# Patient Record
Sex: Male | Born: 1945 | Race: Black or African American | Hispanic: No | Marital: Married
Health system: Southern US, Community
[De-identification: ages and names within clinical notes are randomized; demographics above are authoritative.]

---

## 2007-12-02 ENCOUNTER — Ambulatory Visit: Payer: Self-pay | Admitting: Internal Medicine

## 2008-03-06 ENCOUNTER — Other Ambulatory Visit: Payer: Self-pay

## 2008-03-06 ENCOUNTER — Inpatient Hospital Stay: Payer: Self-pay | Admitting: Internal Medicine

## 2008-03-12 ENCOUNTER — Ambulatory Visit: Payer: Self-pay | Admitting: Family Medicine

## 2008-10-18 IMAGING — CT CT ABD-PELV W/O CM
1 of 2 series · 15 of 32 positions shown, 19 images · non-contrast
Comparison: none

REASON FOR EXAM: (1) abdominal pain, acute renal failure, vomiting; (2)
abdominal pain, vomiting
COMMENTS:

[Series 2: abdomen · axial · 0.93mm/px · z∈[+94,+529]mm · 15 of 95 slices shown, 19 images]
[im 4/95  soft-tissue]
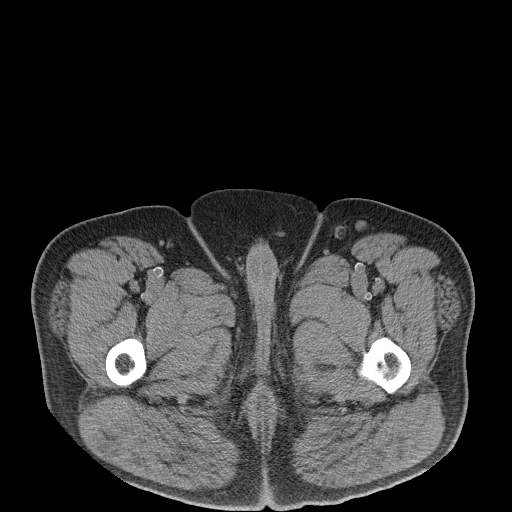
[im 4/95  bone]
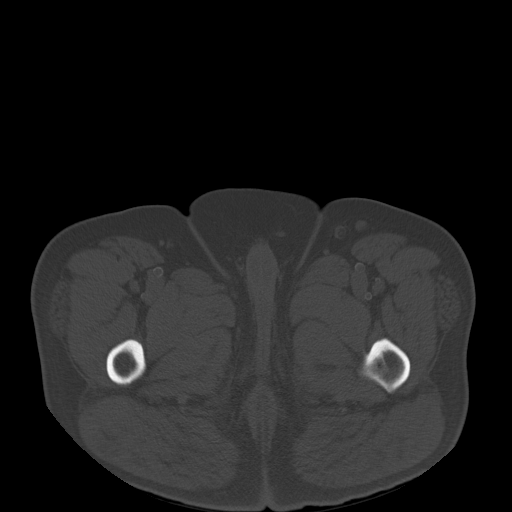
[im 11/95  soft-tissue]
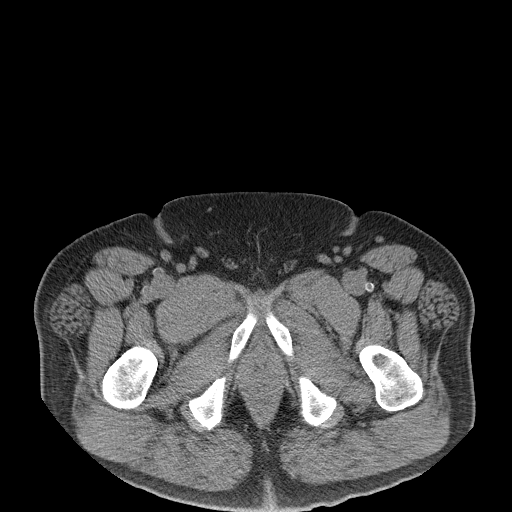
[im 19/95  soft-tissue]
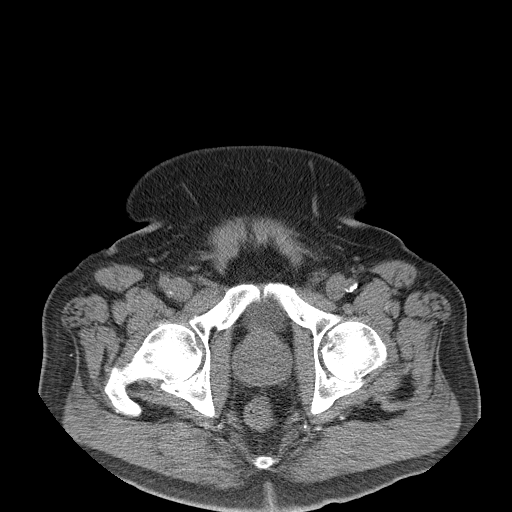
[im 26/95  soft-tissue]
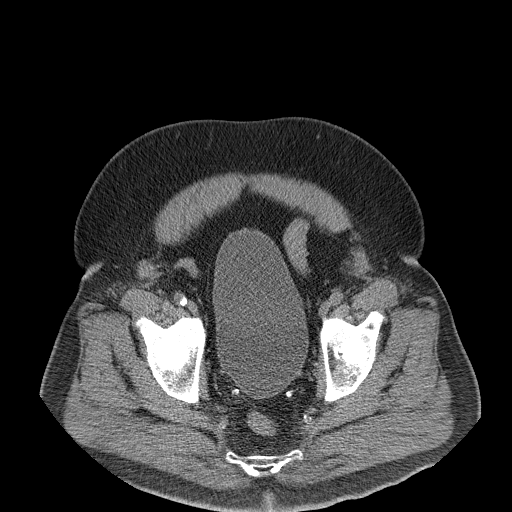
[im 33/95  soft-tissue]
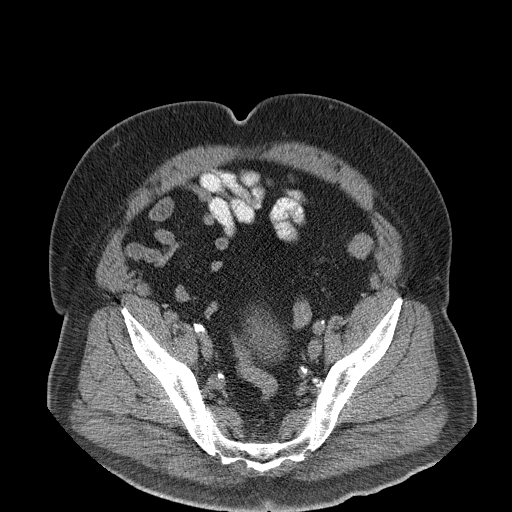
[im 40/95  soft-tissue]
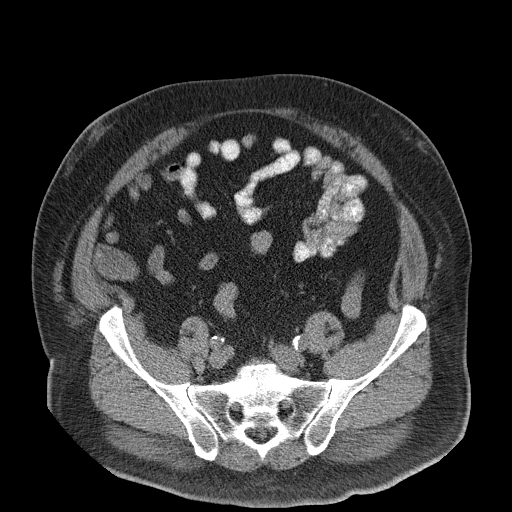
[im 48/95  soft-tissue]
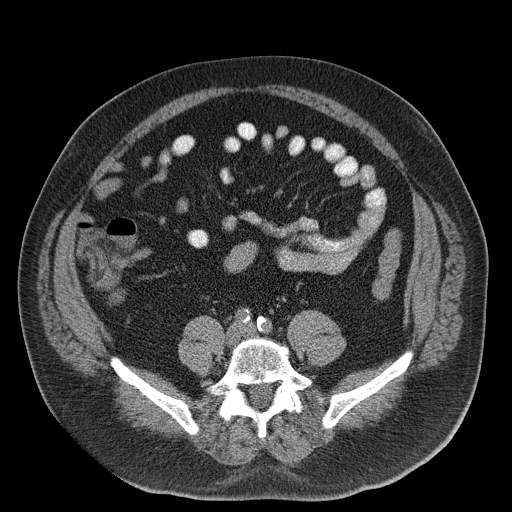
[im 55/95  soft-tissue]
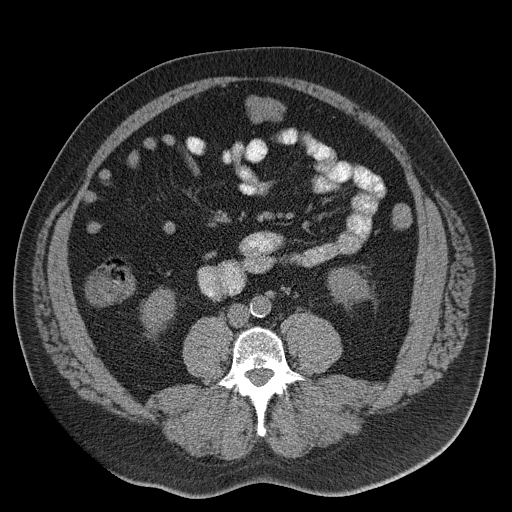
[im 62/95  soft-tissue]
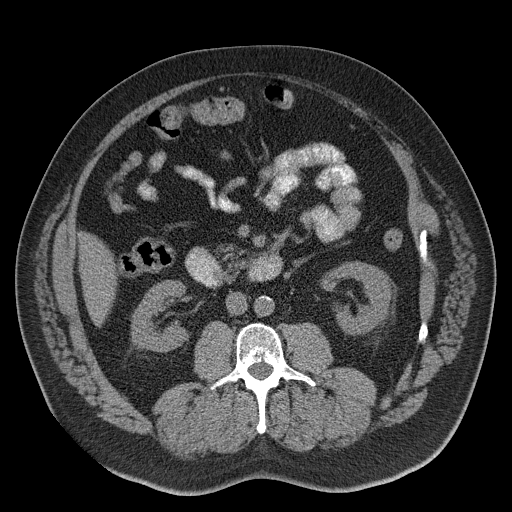
[im 62/95  bone]
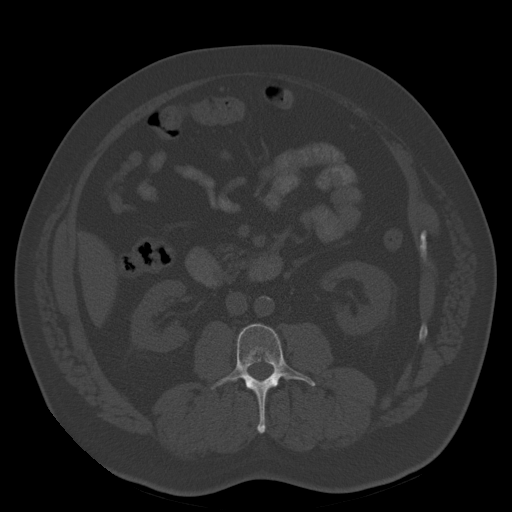
[im 69/95  soft-tissue]
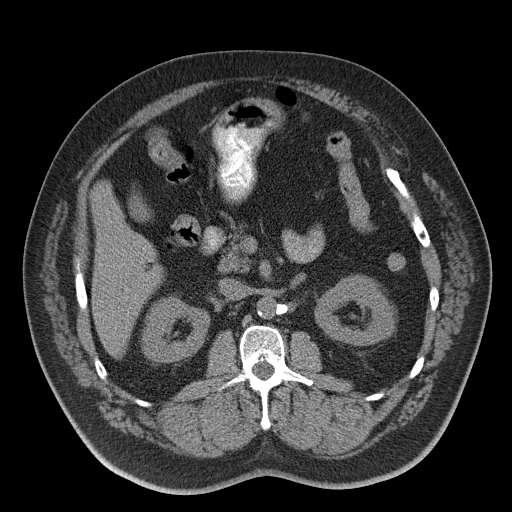
[im 76/95  soft-tissue]
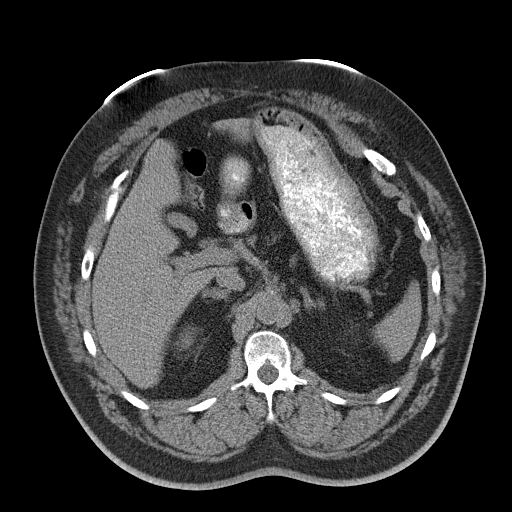
[im 80/95  lung]
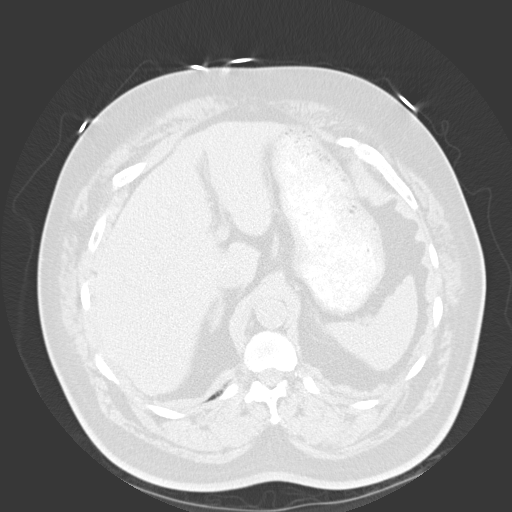
[im 84/95  soft-tissue]
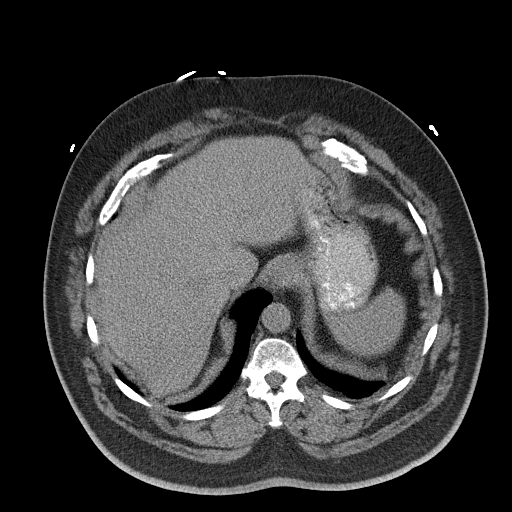
[im 84/95  lung]
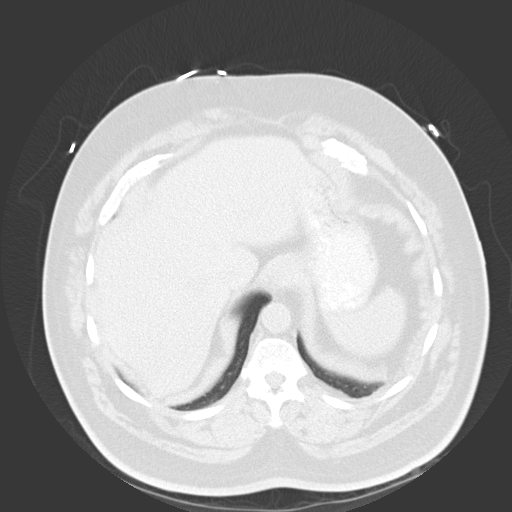
[im 87/95  lung]
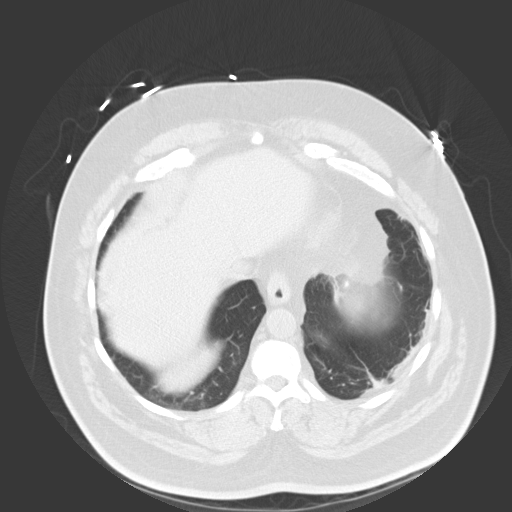
[im 91/95  soft-tissue]
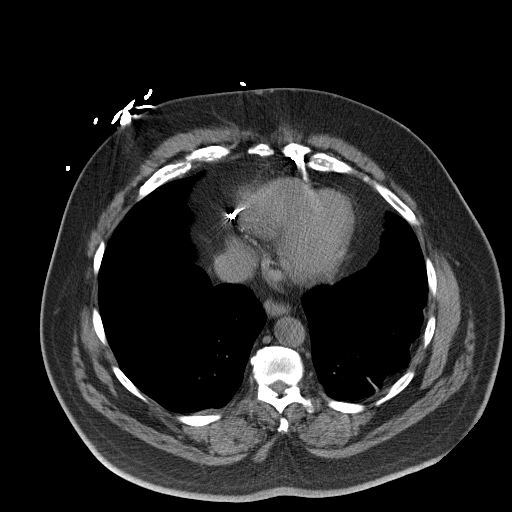
[im 91/95  lung]
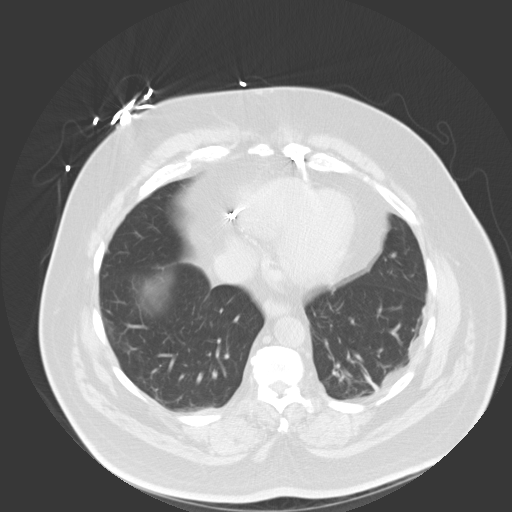

[15 of 32 positions shown; findings below may reference images not displayed]

PROCEDURE:     CT  - CT ABDOMEN AND PELVIS W[DATE] [DATE]

RESULT:     Helical non-contrast 5 mm sections were obtained from the lung
bases through the pubic symphysis.

Evaluation of the lung bases demonstrates hypoventilation within the RIGHT
and LEFT lung base. Within the limitations of a noncontrast CT, the liver,
spleen, adrenals, pancreas, and kidney are unremarkable.

Evaluation of the LEFT kidney demonstrates a low attenuating rounded mass
along the posterior midpole region possibly representing a cyst. There is no
evidence of abdominal or pelvic free fluid, drainable loculated fluid
collections, masses nor adenopathy. There is no CT evidence of bowel
obstruction, diverticulitis, appendicitis, free air nor an abdominal aortic
aneurysm. Mural calcifications are identified within the abdominal aorta.
IMPRESSION: 1.No CT evidence of focal or acute intraabdominal or intrapelvic
abnormalities within the limitations of a non-IV contrast CT. Note; the
patient did receive oral contrast. The urinary bladder is partially
distended with urine and if clinically warranted catheterization may be
beneficial.

2.Findings likely representing a cyst involving the LEFT kidney. This can be
further evaluated with ultrasound, if clinically warranted.

## 2011-05-14 ENCOUNTER — Ambulatory Visit: Payer: Self-pay

## 2011-05-31 ENCOUNTER — Ambulatory Visit: Payer: Self-pay

## 2011-06-07 ENCOUNTER — Ambulatory Visit: Payer: Self-pay

## 2012-02-01 ENCOUNTER — Inpatient Hospital Stay: Payer: Self-pay | Admitting: Internal Medicine

## 2012-02-01 LAB — COMPREHENSIVE METABOLIC PANEL
Albumin: 4.2 g/dL (ref 3.4–5.0)
Alkaline Phosphatase: 93 U/L (ref 50–136)
Anion Gap: 12 (ref 7–16)
Bilirubin,Total: 0.2 mg/dL (ref 0.2–1.0)
Calcium, Total: 9.9 mg/dL (ref 8.5–10.1)
Chloride: 104 mmol/L (ref 98–107)
Creatinine: 3.29 mg/dL — ABNORMAL HIGH (ref 0.60–1.30)
EGFR (Non-African Amer.): 19 — ABNORMAL LOW
Glucose: 110 mg/dL — ABNORMAL HIGH (ref 65–99)
Osmolality: 314 (ref 275–301)
Potassium: 3.6 mmol/L (ref 3.5–5.1)
SGOT(AST): 28 U/L (ref 15–37)
SGPT (ALT): 23 U/L
Total Protein: 9.1 g/dL — ABNORMAL HIGH (ref 6.4–8.2)

## 2012-02-01 LAB — URINALYSIS, COMPLETE
Bacteria: NONE SEEN
Bilirubin,UR: NEGATIVE
Hyaline Cast: 12
Ketone: NEGATIVE
Leukocyte Esterase: NEGATIVE
Nitrite: NEGATIVE
Protein: >=500
Squamous Epithelial: NONE SEEN
WBC UR: <1 /HPF (ref 0–5)

## 2012-02-01 LAB — CBC
HCT: 35.3 % — ABNORMAL LOW (ref 40.0–52.0)
HGB: 11.9 g/dL — ABNORMAL LOW (ref 13.0–18.0)
MCH: 29.8 pg (ref 26.0–34.0)
MCHC: 33.8 g/dL (ref 32.0–36.0)
MCV: 88 fL (ref 80–100)
Platelet: 249 10*3/uL (ref 150–440)
RBC: 4 10*6/uL — ABNORMAL LOW (ref 4.40–5.90)

## 2012-02-01 LAB — TROPONIN I
Troponin-I: 0.07 ng/mL — ABNORMAL HIGH
Troponin-I: 0.67 ng/mL — ABNORMAL HIGH

## 2012-02-01 LAB — CK-MB
CK-MB: 10.3 ng/mL — ABNORMAL HIGH (ref 0.5–3.6)
CK-MB: 9.7 ng/mL — ABNORMAL HIGH (ref 0.5–3.6)

## 2012-02-01 LAB — LIPASE, BLOOD: Lipase: 120 U/L (ref 73–393)

## 2012-02-01 LAB — APTT
Activated PTT: 36.2 secs — ABNORMAL HIGH (ref 23.6–35.9)
Activated PTT: >160 secs (ref 23.6–35.9)

## 2012-02-01 LAB — CK TOTAL AND CKMB (NOT AT ARMC): CK-MB: 5.1 ng/mL — ABNORMAL HIGH (ref 0.5–3.6)

## 2012-02-02 LAB — CBC WITH DIFFERENTIAL/PLATELET
Eosinophil #: 0 10*3/uL (ref 0.0–0.7)
Eosinophil %: 0.6 %
HCT: 31.8 % — ABNORMAL LOW (ref 40.0–52.0)
HGB: 10.5 g/dL — ABNORMAL LOW (ref 13.0–18.0)
Lymphocyte #: 1.3 10*3/uL (ref 1.0–3.6)
MCHC: 32.9 g/dL (ref 32.0–36.0)
Monocyte #: 0.4 x10 3/mm (ref 0.2–1.0)
Neutrophil #: 5.4 10*3/uL (ref 1.4–6.5)
Platelet: 196 10*3/uL (ref 150–440)
RBC: 3.56 10*6/uL — ABNORMAL LOW (ref 4.40–5.90)
WBC: 7.2 10*3/uL (ref 3.8–10.6)

## 2012-02-02 LAB — BASIC METABOLIC PANEL
Anion Gap: 8 (ref 7–16)
BUN: 81 mg/dL — ABNORMAL HIGH (ref 7–18)
Calcium, Total: 8.3 mg/dL — ABNORMAL LOW (ref 8.5–10.1)
Chloride: 104 mmol/L (ref 98–107)
EGFR (African American): 24 — ABNORMAL LOW
EGFR (Non-African Amer.): 21 — ABNORMAL LOW
Glucose: 286 mg/dL — ABNORMAL HIGH (ref 65–99)
Osmolality: 316 (ref 275–301)
Potassium: 4.5 mmol/L (ref 3.5–5.1)
Sodium: 141 mmol/L (ref 136–145)

## 2012-02-02 LAB — MAGNESIUM: Magnesium: 2.4 mg/dL

## 2012-02-02 LAB — APTT: Activated PTT: 129 secs — ABNORMAL HIGH (ref 23.6–35.9)

## 2012-11-24 ENCOUNTER — Emergency Department: Payer: Self-pay | Admitting: Emergency Medicine

## 2012-11-24 LAB — CBC
HCT: 32.3 % — ABNORMAL LOW (ref 40.0–52.0)
HGB: 10.5 g/dL — ABNORMAL LOW (ref 13.0–18.0)
MCH: 28.2 pg (ref 26.0–34.0)
RDW: 16.3 % — ABNORMAL HIGH (ref 11.5–14.5)
WBC: 8.3 10*3/uL (ref 3.8–10.6)

## 2012-11-25 LAB — BASIC METABOLIC PANEL
Anion Gap: 8 (ref 7–16)
BUN: 72 mg/dL — ABNORMAL HIGH (ref 7–18)
Calcium, Total: 8.2 mg/dL — ABNORMAL LOW (ref 8.5–10.1)
EGFR (African American): 16 — ABNORMAL LOW
Glucose: 145 mg/dL — ABNORMAL HIGH (ref 65–99)
Osmolality: 298 (ref 275–301)
Potassium: 4.1 mmol/L (ref 3.5–5.1)
Sodium: 137 mmol/L (ref 136–145)

## 2012-11-25 LAB — CK TOTAL AND CKMB (NOT AT ARMC): CK, Total: 756 U/L — ABNORMAL HIGH (ref 35–232)

## 2012-11-25 LAB — TROPONIN I: Troponin-I: 0.23 ng/mL — ABNORMAL HIGH

## 2012-11-25 LAB — HEPATIC FUNCTION PANEL A (ARMC)
Alkaline Phosphatase: 90 U/L (ref 50–136)
Bilirubin, Direct: <0.05 mg/dL (ref 0.00–0.20)
Bilirubin,Total: 0.2 mg/dL (ref 0.2–1.0)
SGOT(AST): 30 U/L (ref 15–37)

## 2012-11-25 LAB — PRO B NATRIURETIC PEPTIDE: B-Type Natriuretic Peptide: 3350 pg/mL — ABNORMAL HIGH (ref 0–125)

## 2012-11-25 LAB — APTT: Activated PTT: 43.6 secs — ABNORMAL HIGH (ref 23.6–35.9)

## 2012-11-25 LAB — PROTIME-INR
INR: 1.1
Prothrombin Time: 14.7 secs (ref 11.5–14.7)

## 2013-04-19 ENCOUNTER — Ambulatory Visit: Payer: Self-pay | Admitting: Emergency Medicine

## 2013-05-18 DEATH — deceased

## 2013-07-08 IMAGING — CR DG CHEST 1V PORT
1 series · 1 of 1 positions shown · non-contrast
Comparison: none

REASON FOR EXAM: Chest Pain
COMMENTS:

[ap]
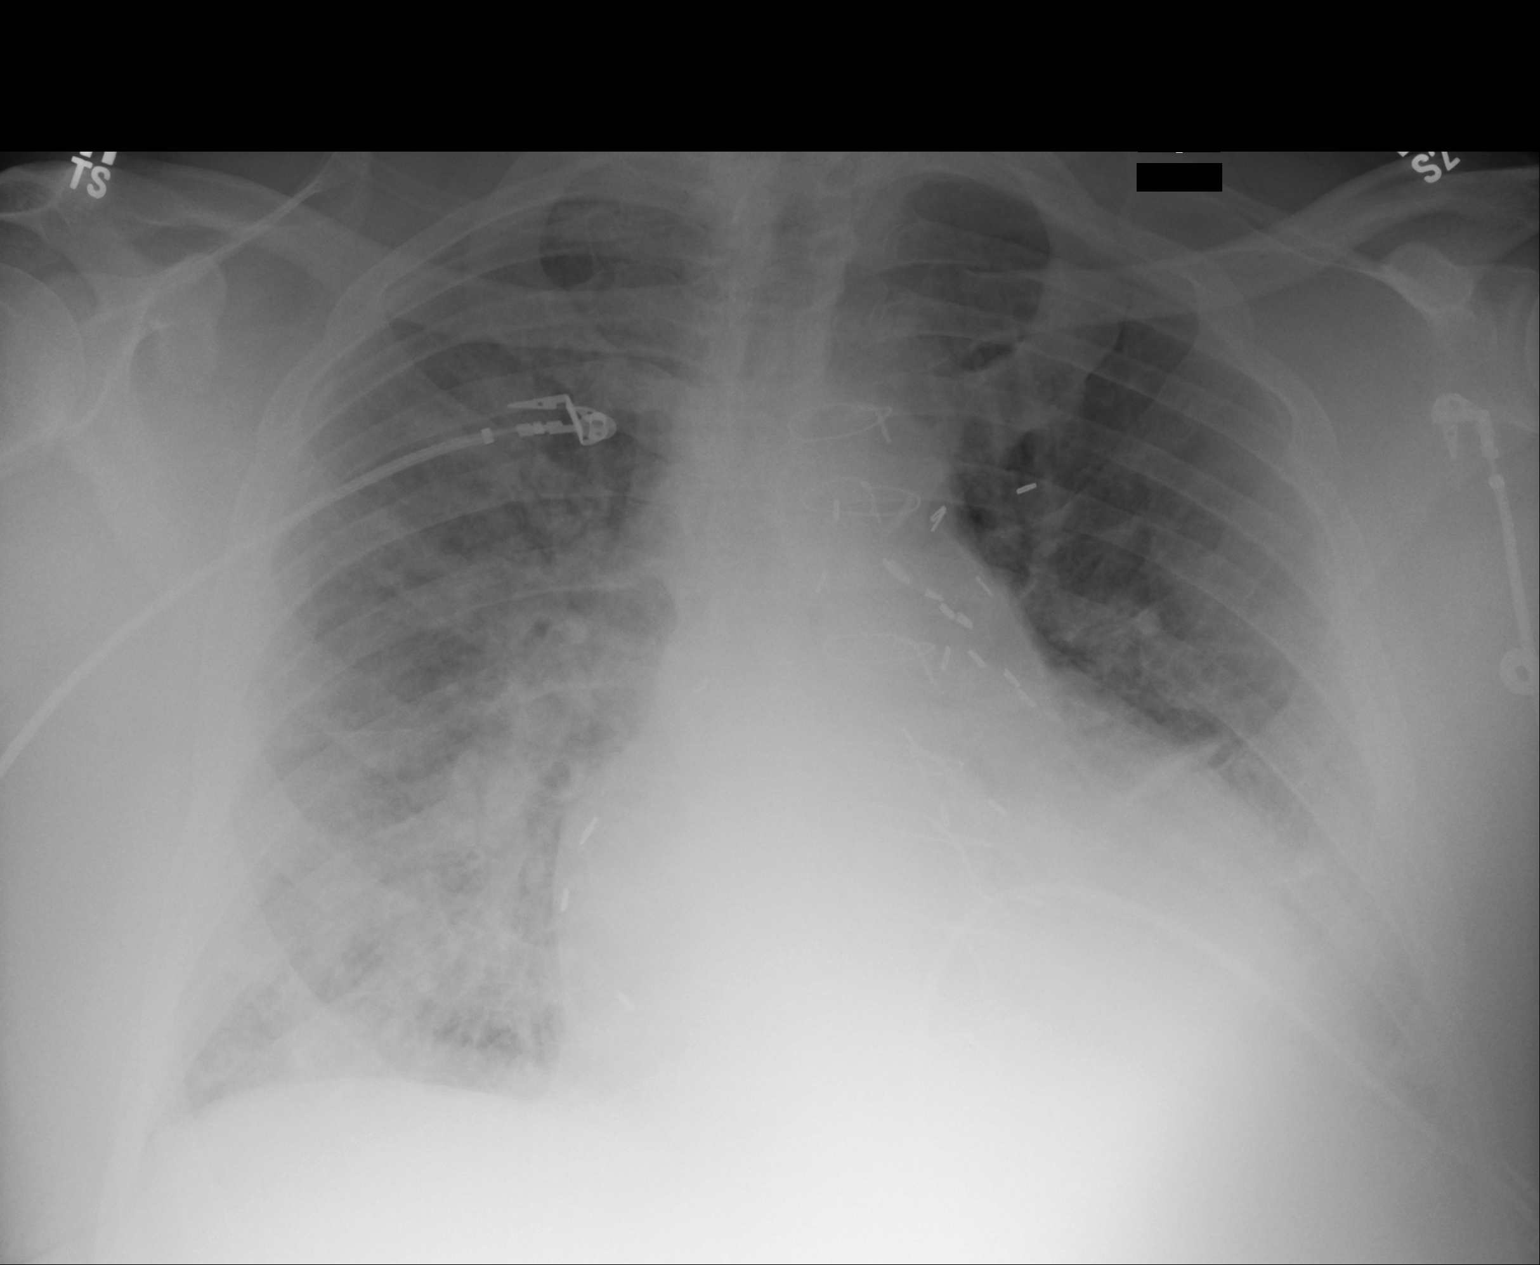

[1 of 1 positions shown; findings below may reference images not displayed]

PROCEDURE:     DXR - DXR PORTABLE CHEST SINGLE VIEW  - November 24, 2012 [DATE]

RESULT:     Comparison is made to the study February 01, 2012.

The cardiac silhouette is enlarged. The left hemidiaphragm is obscured. The
pulmonary vascularity is engorged and indistinct. The interstitial markings
of both lungs are increased. There are surgical clips overlying the aortic
arch and AP window region.
IMPRESSION: The findings are consistent with congestive heart failure
with pulmonary interstitial edema.

[REDACTED]

## 2015-01-09 NOTE — Discharge Summary (Signed)
PATIENT NAME:  Mark Rosario, Mark Rosario MR#:  [id number] DATE OF BIRTH:  1946-04-30  DATE OF ADMISSION:  02/01/2012 DATE OF DISCHARGE:  02/02/2012  ADDENDUM  DISCHARGE DIAGNOSES:  1. Non-ST-elevation myocardial infarction. 2. Hypertension. 3. Polymyositis. 4. Diabetes. 5. Chronic kidney disease. 6. Sleep apnea.   HOSPITAL COURSE: The patient remained stable during the hospitalization. He was on a heparin drip and did not have any further episodes of chest pain. He was hemodynamically stable. All the rest of his medical problems were stable. [facility] in [facility] condition.   TIME SPENT: 45 minutes.  ____________________________ Darrick MeigsSangeeta Chau Savell, MD sp:slb D: 02/03/2012 12:32:44 ET T: 02/03/2012 14:59:14 ET JOB#: [id number]  cc: Darrick MeigsSangeeta Lachandra Dettmann, MD, <Dictator> Darrick MeigsSANGEETA Leviticus Harton MD ELECTRONICALLY SIGNED 02/04/2012 7:11

## 2015-01-09 NOTE — Discharge Summary (Signed)
PATIENT NAME:  Mark Rosario, Mark Rosario MR#:  [id number] DATE OF BIRTH:  [birth year]  DATE OF ADMISSION:  02/01/2012 DATE OF DISCHARGE:  02/01/2012  DISPOSITION:  The patient is to be [facility] upon bed availability on 02/01/2012.   CHIEF COMPLAINT: Epigastric pain, abdominal pain, weakness.  CURRENT DIAGNOSES:  1. Non-ST elevation myocardial infarction.  2. Hypertension, uncontrolled.  3. Polymyositis.  4. Diabetes.  5. Chronic kidney disease stage IV.   SECONDARY DIAGNOSES:  1. Coronary artery disease status post coronary artery [facility].  2. Diabetes.  3. Gastroparesis.  4. Obstructive sleep apnea on CPAP.  5. Morbid obesity.  6. History of pancreatitis.  7. History of congestive heart failure.   CURRENT MEDICATIONS:  1. Amlodipine 10 mg daily.  2. Coreg 6.25 mg b.i.d.  3. Clonidine 0.2 mg b.i.d.  4. Hydralazine 100 mg t.i.d.  5. Imdur 30 mg daily.  6. Nitroglycerin ointment 2% 1 inch times one was given.  7. Nitroglycerin patch 0.4 mg topically daily.  8. Pantoprazole 40 mg daily.  9. Crestor 10 mg daily.  10. Aspirin 81 mg.  11. Plavix 75 mg daily.  12. Sliding scale insulin.  13. Heparin drip.  14. Morphine 4 mg IV times one given.  15. Hydralazine 10 mg IV every 4 to 6 hours as needed for uncontrolled blood pressure.  16. Morphine 2 mg- 4 mg IV push every four hours as needed for pain.  17. Zofran 4 mg every four hours p.r.n. for nausea and vomiting.   DISPOSITION: Transfer to [facility] when as soon as a bed is available.   DIET: Low sodium, ADA renal diabetic diet.   ACTIVITY: As tolerated.   HISTORY OF PRESENT ILLNESS: For full details, please see the History and Physical dictated on 02/01/2012 by Dr. Susie CassetteAbrol but briefly this 69 year old morbidly obese male with obstructive sleep apnea, diabetes, coronary artery disease who underwent a catheterization in 07/2011 at [facility] presents with the above chief complaint. The patient  also complained of some shortness of breath around 10:00. EMS was called and on arrival the patient was noted to have dynamic ST changes on the EKGs, noted to have ST depressions in septal and lateral leads, and had an elevated troponin of 0.07 with elevated CK-MB of 5.1. He was admitted to the hospitalist service.   SIGNIFICANT LABS/IMAGING: BUN 86, creatinine is 3.29, GFR 22. LFTs: Total protein 9.1, otherwise within normal limits. CK total 671, CK-MB 5.1 and then 10.3. Initial troponin 0.07, second troponin came back at 0.67. WBC is 9.7, hemoglobin 11.9, hematocrit 35.3, platelets 249, MCV is 88. Last PTT we have is 145.9.   Urinalysis: Greater than 500 protein, no nitrites or leukocyte esterase, less than one WBC, no bacteria.   X-ray chest, one view, low-grade congestive heart failure, chronic congestive heart failure is more likely.   HOSPITAL COURSE: The patient was admitted earlier this morning. He currently has no chest pain. However, the troponin has been going up as well as CK-MB. He is on aspirin, beta blocker, Plavix, Imdur, and heparin drip including the medications above. He did have dynamic ST changes and on the last EKG we have the ST depressions have mostly resolved. The case was discussed by the admitting hospitalist with [facility] and the patient has been accepted for transfer and is awaiting bed availability. Per the admitting History  and Physical the admitting physician will be Dr. Excell SeltzerBaker. The patient states that his primary  care physician as well as his specialists are at [facility] While hospitalized he did have suboptimal blood pressure. On arrival it was 191/84. His home medications have been resumed. His last blood pressure was 148/53. He is on 2 liters nasal cannula. CPAP has been ordered for him as well.  He is on sliding scale insulin. He does have elevated creatinine. His baseline appears to be around stage IV.   CODE STATUS: FULL CODE.   TOTAL TIME SPENT: 50  minutes.    ____________________________ Krystal Eaton, MD sa:bjt D: 02/01/2012 14:35:00 ET T: 02/01/2012 15:00:41 ET JOB#: [id number]  cc: Krystal Eaton, MD, <Dictator> Krystal Eaton MD ELECTRONICALLY SIGNED 02/22/2012 12:32

## 2015-01-09 NOTE — H&P (Signed)
PATIENT NAME:  Mark Rosario, Mark Rosario MR#:  [id number] DATE OF BIRTH:  [birth year]  DATE OF ADMISSION:  02/01/2012  PR[place] CARE PHYSICIAN[place]  SUBJECTIVE: This is a 69 year old morbidly obese male with a history of obstructive sleep apnea, diabetes, coronary artery disease, status post cardiac [facility] November 2012 [facility] who presents to the ER with a chief complaint of epigastric pain as well as diffuse abdominal pain, weakness in his legs and fairly acute onset of shortness of breath at around 10:00 a.m. this morning. The symptoms lasted for about two hours without any relief and the patient's wife called EMS at around midnight. The patient otherwise has been in good health. He denies any ongoing fever, chills, rigors, cough. He denies any dizziness or lightheadedness. The patient was found to have dynamic EKG changes with ST depressions in V1, V2, and aVF with evolutionary changes on serial EKGs in the ER. The patient also had a mild elevation of troponin of 0.07 and was found to have dynamic EKG changes and is being evaluated for non-ST elevation MI. His LFTs and lipase are within normal limits.    PAST MEDICAL HISTORY:  1. Coronary artery disease, status post prior stent. Most recent cardiac [facility].  2. Diabetes.  3. CABG.  4. Hypertension.  5. Renal insufficiency.  6. Gastroparesis.  7. Obstructive sleep apnea.  8. Morbid obesity. 9. Polymyositis. 10. Previous history of pancreatitis. 11. History of congestive heart failure.   [place]   HOME MEDICATIONS:  1. Aspirin 81 mg p.o. daily. 2. Allopurinol 100 mg p.o. daily. 3. Norvasc 10 mg p.o. daily. 4. Coreg 6.25 mg p.o. daily. 5. Clonidine 0.2 mg twice a day. 6. Plavix 75 mg p.o. daily. 7. Crestor 5 mg p.o. daily.  8. Doxazosin 4 mg p.o. daily.  9. Humulin N. 10. Humulin R. 11. Hydralazine 100 mg p.o. t.i.d.  12. Imdur 20 mg p.o. daily.   13. Omeprazole 20 mg p.o. daily. 14. Zoloft 100 mg p.o. daily. 15. Torsemide 20 mg p.o. b.i.d.  16. Zofran 4 mg p.o. daily.   SOCIAL HISTORY: No smoking or alcohol. Lives with his wife.   FAMILY HISTORY: No history of coronary artery disease or cancer.   REVIEW OF SYSTEMS: CONSTITUTIONAL: Complains of fatigue, but no fever. Complains of generalized weakness and weakness in his leg. EYES: Blurry vision, double vision. No glaucoma or cataracts. ENT: No tinnitus, ear pain, hearing loss, allergies. RESPIRATORY: No cough, wheezing, hemoptysis, dyspnea. CARDIOVASCULAR: Chest pain free now. Does have some orthopnea and dependent edema. No palpitations or syncope. GASTROINTESTINAL: No nausea, vomiting. Diffuse abdominal pain, mostly epigastric. No melena. No ulcers. GENITOURINARY: No dysuria, hematuria, renal calculi. No polyuria, nocturia. ENDOCRINE: No increased sweating, heat or cold intolerance or thirst. HEME: No anemia. No easy bruising or bleeding. MUSCULOSKELETAL: Proximal muscle weakness in bilateral lower extremities with fairly acute onset  acute exacerbation of polymyositis. NEUROLOGICAL: No dysarthria, tremor, vertigo, ataxia. PSYCHIATRIC: No anxiety, insomnia, bipolar disorder, schizophrenia.   PHYSICAL EXAMINATION: VITAL SIGNS: Blood pressure 198/96, respirations 18, pulse 66, 100% on 2 liters.   GENERAL: Comfortable currently, no acute cardiopulmonary distress.  HEENT: Pupils equal and reactive. Extraocular movements intact.   NECK: Supple. No JVD.   LUNGS: Minimal bibasilar crackles at the bases.   CARDIOVASCULAR: Regular rate and rhythm. No murmurs, rubs, or gallops.   ABDOMEN: Soft, nontender, nondistended.   EXTREMITIES: Trace pedal edema in bilateral lower extremities.  PSYCHIATRIC: Appropriate mood and affect.   LABORATORY, DIAGNOSTIC AND RADIOLOGICAL DATA: Glucose 110, BUN 86, creatinine 2.29, sodium 144, potassium 3.6, bicarbonate 28, chloride 104, calcium 9.9, lipase  120, AST 28, ALT 23, troponin 0.07, WBC 9.7, hemoglobin 11.9, hematocrit 35.3, platelet count 249.   ASSESSMENT AND PLAN:  1. Non-ST elevation MI. Patient has been started on aspirin, Plavix, nitro paste, heparin drip,  as needed. I have spoken to cardiology fellow, Dr. Crist Infante at [phone] who has accepted the patient. EKGs have been faxed to the floor for his review. The patient may need cardiac intervention depending on if the patient continues to have EKG changes and ongoing symptoms.  2. Hypertension. Patient will be resumed on his Coreg, clonidine, doxazosin, Norvasc. We will also use p.r.n. hydralazine.  3. Chronic kidney disease stage 3 with a baseline creatinine of around 2.7 to 2.9. Creatinine 3.29  baseline. Will hydrate gently with IV fluids.  4. Diabetes. The patient will be kept n.p.o. We will continue Accu-Cheks with sliding scale insulin.  5. Disposition. Transfer to [facility] when bed available.   ____________________________ Richarda Overlie, MD na:cms D: 02/01/2012 07:03:47 ET T: 02/01/2012 07:36:50 ET JOB#: [id number] cc: Richarda Overlie, MD, <Dictator> Dr.  in [place] B[place]berg MD ELECTRONICALLY SIGNED 02/27/2012 0:13
